# Patient Record
Sex: Female | Born: 1965 | Race: White | Hispanic: No | Marital: Married | Smoking: Never smoker
Health system: Southern US, Community
[De-identification: ages and names within clinical notes are randomized; demographics above are authoritative.]

## PROBLEM LIST (undated history)

## (undated) DIAGNOSIS — T7840XA Allergy, unspecified, initial encounter: Secondary | ICD-10-CM

## (undated) DIAGNOSIS — I10 Essential (primary) hypertension: Secondary | ICD-10-CM

## (undated) HISTORY — DX: Essential (primary) hypertension: I10

## (undated) HISTORY — DX: Allergy, unspecified, initial encounter: T78.40XA

---

## 2005-01-30 ENCOUNTER — Encounter: Admission: RE | Admit: 2005-01-30 | Discharge: 2005-01-30 | Payer: Self-pay | Admitting: Family Medicine

## 2005-02-27 ENCOUNTER — Encounter: Admission: RE | Admit: 2005-02-27 | Discharge: 2005-02-27 | Payer: Self-pay | Admitting: Obstetrics and Gynecology

## 2005-09-05 ENCOUNTER — Encounter: Admission: RE | Admit: 2005-09-05 | Discharge: 2005-09-05 | Payer: Self-pay | Admitting: Obstetrics and Gynecology

## 2005-09-06 ENCOUNTER — Other Ambulatory Visit: Admission: RE | Admit: 2005-09-06 | Discharge: 2005-09-06 | Payer: Self-pay | Admitting: Obstetrics and Gynecology

## 2005-11-30 ENCOUNTER — Ambulatory Visit (HOSPITAL_COMMUNITY): Admission: RE | Admit: 2005-11-30 | Discharge: 2005-11-30 | Payer: Self-pay | Admitting: Obstetrics and Gynecology

## 2006-06-03 ENCOUNTER — Ambulatory Visit: Payer: Self-pay | Admitting: Family Medicine

## 2006-07-08 ENCOUNTER — Ambulatory Visit: Payer: Self-pay | Admitting: Family Medicine

## 2006-09-09 ENCOUNTER — Ambulatory Visit: Payer: Self-pay | Admitting: Family Medicine

## 2006-11-15 ENCOUNTER — Ambulatory Visit: Payer: Self-pay | Admitting: Family Medicine

## 2006-12-18 ENCOUNTER — Ambulatory Visit: Payer: Self-pay | Admitting: Family Medicine

## 2007-02-17 ENCOUNTER — Ambulatory Visit: Payer: Self-pay | Admitting: Family Medicine

## 2007-03-06 ENCOUNTER — Ambulatory Visit: Payer: Self-pay | Admitting: Family Medicine

## 2007-03-07 ENCOUNTER — Ambulatory Visit: Payer: Self-pay | Admitting: Family Medicine

## 2007-03-16 IMAGING — US US RENAL
1 series · 14 of 25 positions shown · non-contrast
Comparison: None

CLINICAL DATA: Right renal atrophy

RENAL/URINARY TRACT ULTRASOUND
TECHNIQUE: Complete ultrasound examination of the urinary tract was performed
including evaluation of the kidneys, renal collecting systems, and urinary
bladder.

[Series 1: unknown · 0.23mm/px · 14 of 30 slices shown]
[im 1/30]
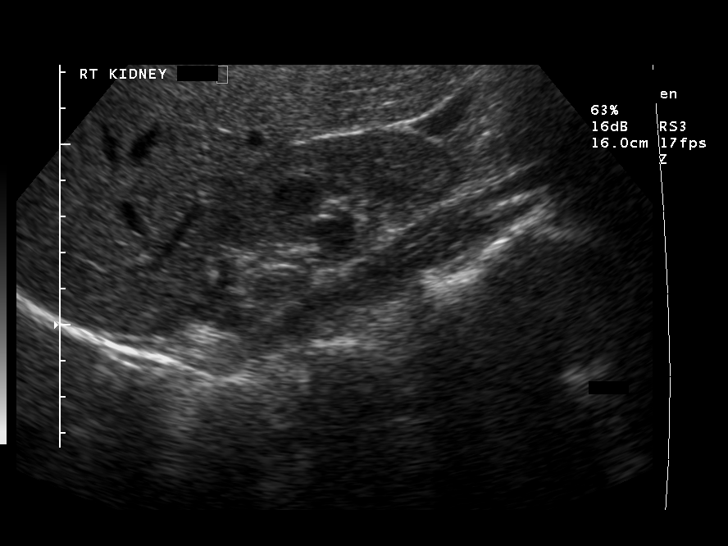
[im 3/30]
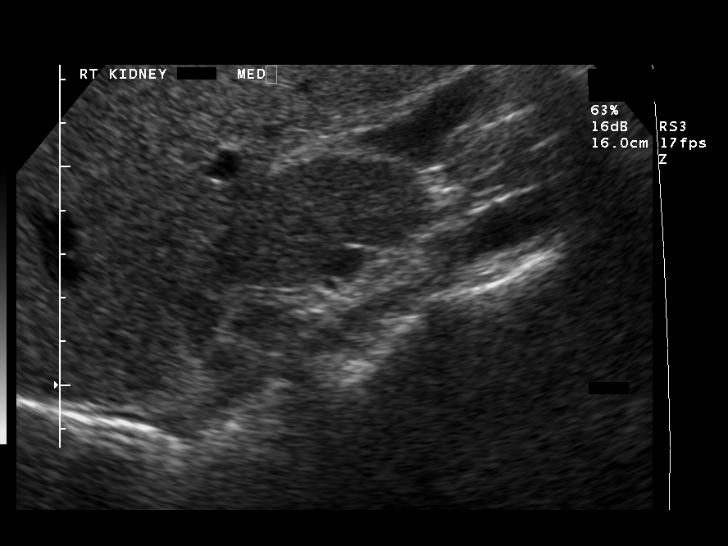
[im 5/30]
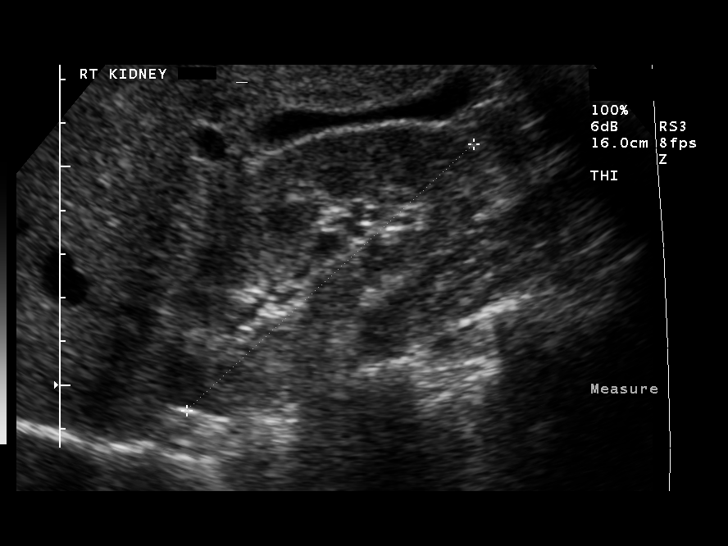
[im 8/30]
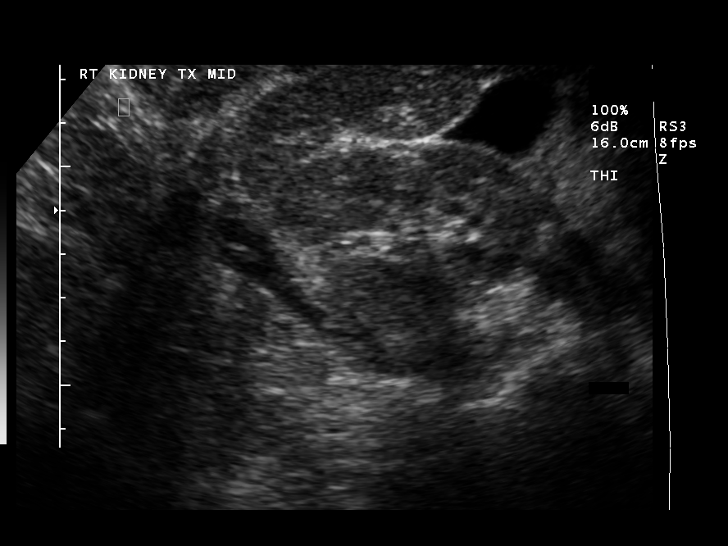
[im 10/30]
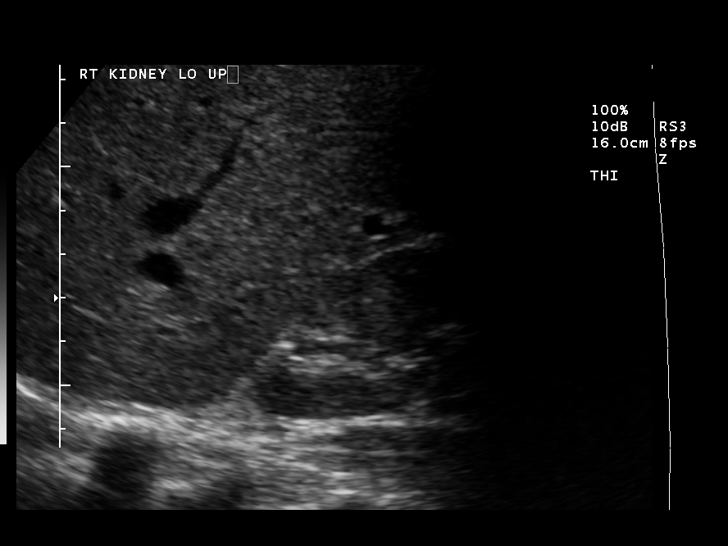
[im 11/30]
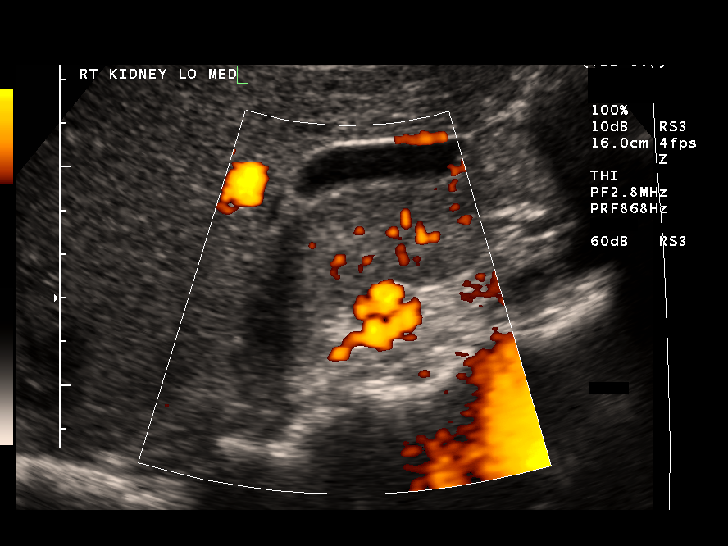
[im 14/30]
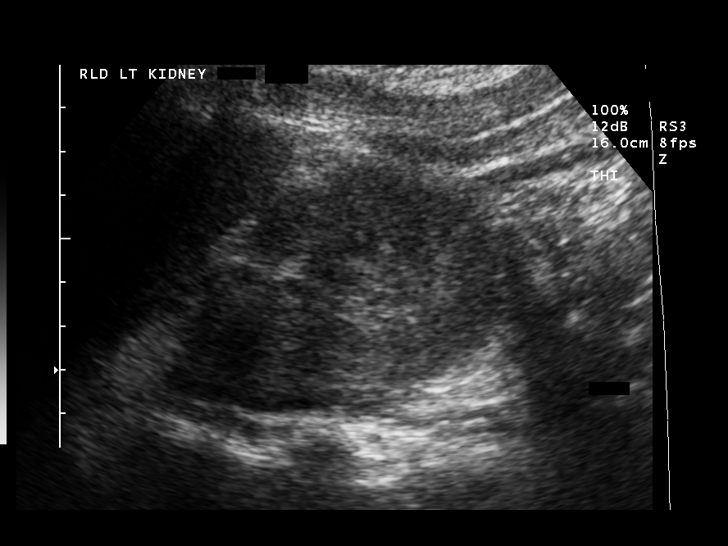
[im 16/30]
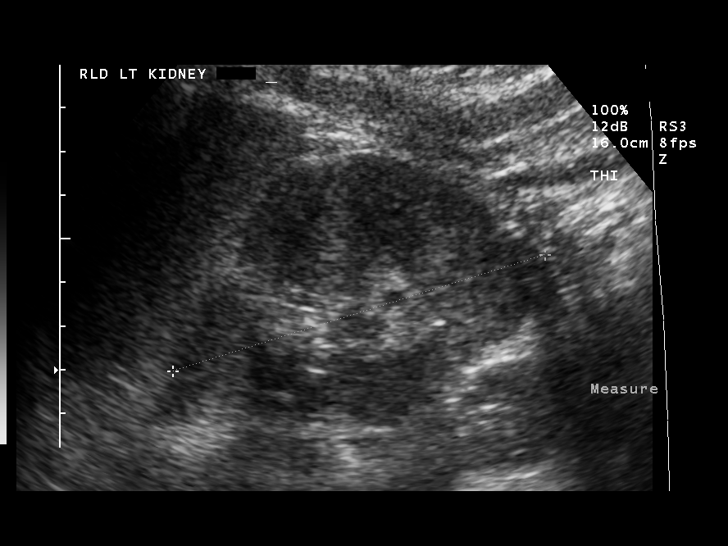
[im 19/30]
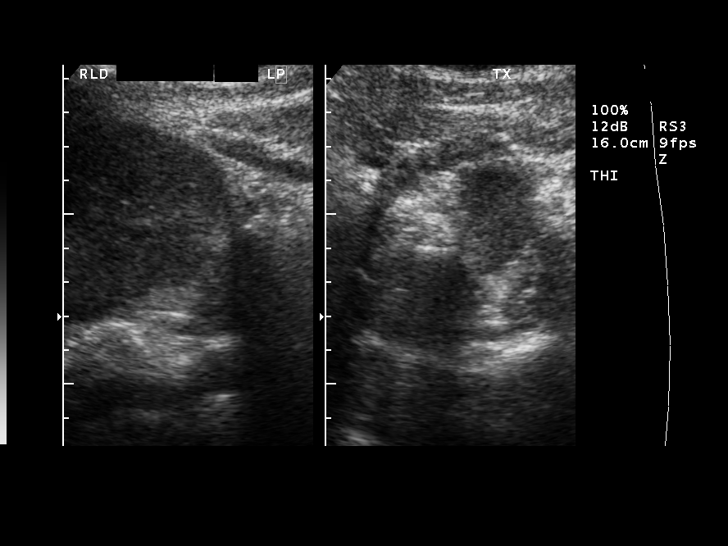
[im 20/30]
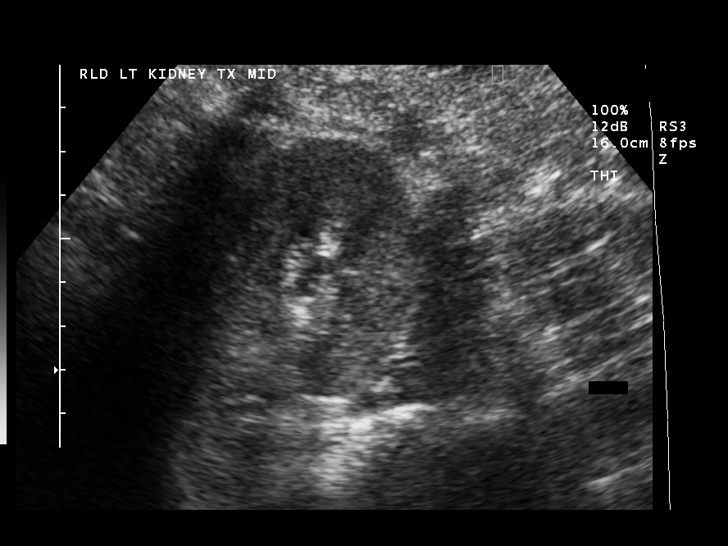
[im 22/30]
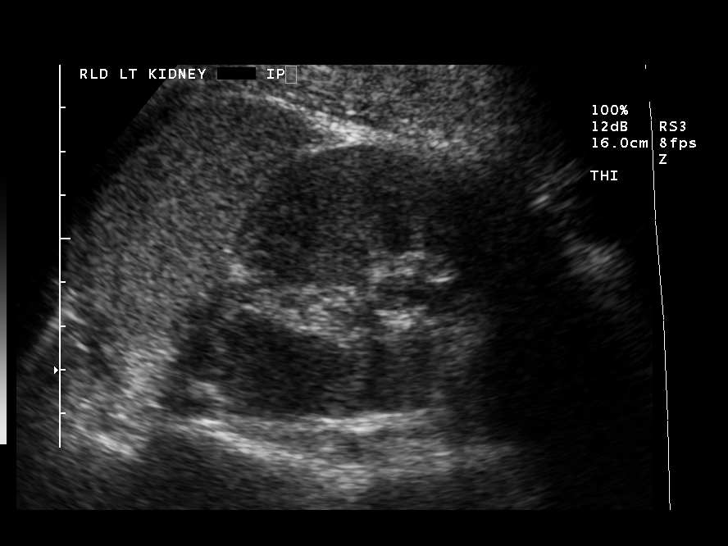
[im 25/30]
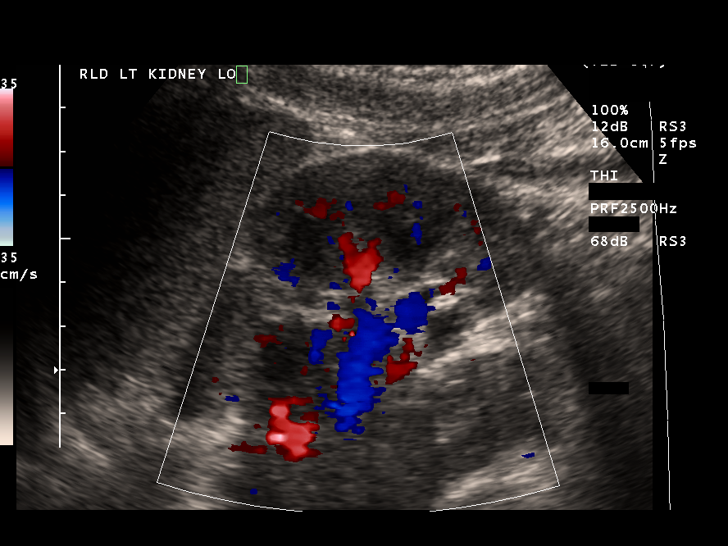
[im 27/30]
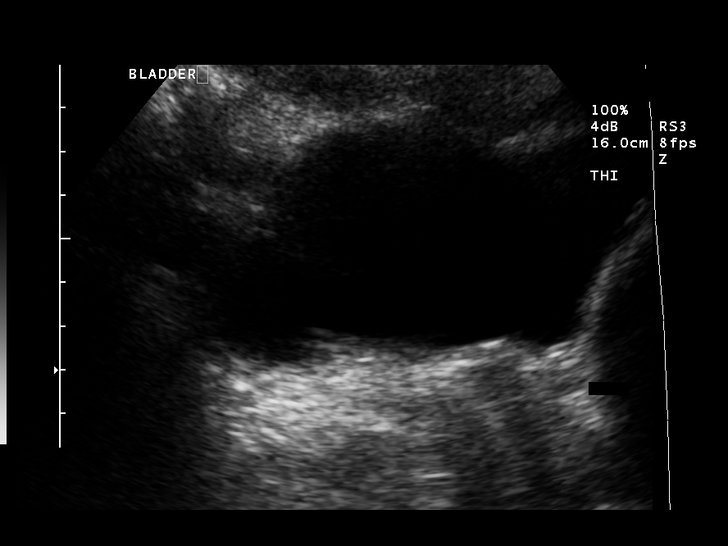
[im 30/30]
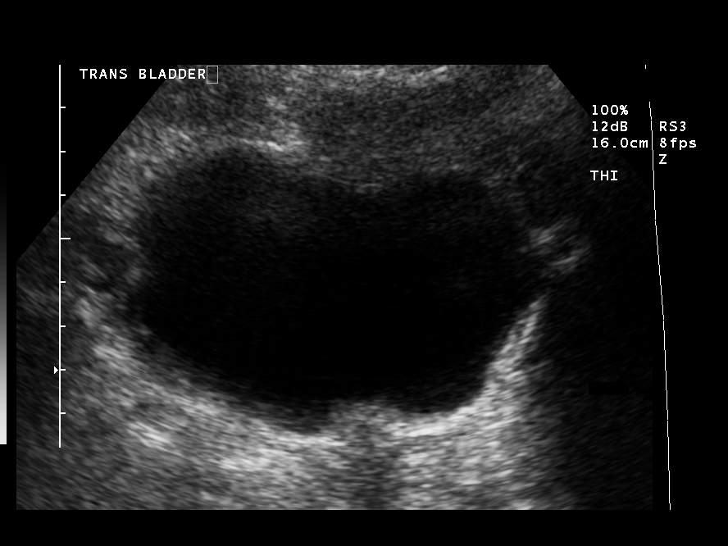

[14 of 25 positions shown; findings below may reference images not displayed]

FINDINGS: Right kidney measures 9.0 cm. Left kidney measures 9.1 cm. There is
cortical thinning noted in the upper poles bilaterally, which can be seen with
reflux nephropathy. Bladder unremarkable. No hydronephrosis.

IMPRESSION

Kidneys are symmetric in size. Cortical thinning in the upper poles bilaterally,
question chronic reflux nephropathy.

## 2007-07-03 ENCOUNTER — Ambulatory Visit: Payer: Self-pay | Admitting: Family Medicine

## 2007-07-24 ENCOUNTER — Ambulatory Visit: Payer: Self-pay | Admitting: Family Medicine

## 2007-12-16 ENCOUNTER — Ambulatory Visit: Payer: Self-pay | Admitting: Family Medicine

## 2008-01-01 ENCOUNTER — Encounter (INDEPENDENT_AMBULATORY_CARE_PROVIDER_SITE_OTHER): Payer: Self-pay | Admitting: Obstetrics and Gynecology

## 2008-01-01 ENCOUNTER — Ambulatory Visit (HOSPITAL_COMMUNITY): Admission: RE | Admit: 2008-01-01 | Discharge: 2008-01-01 | Payer: Self-pay | Admitting: Obstetrics and Gynecology

## 2008-04-26 ENCOUNTER — Ambulatory Visit: Payer: Self-pay | Admitting: Family Medicine

## 2008-08-24 ENCOUNTER — Ambulatory Visit: Payer: Self-pay | Admitting: Family Medicine

## 2009-08-31 ENCOUNTER — Ambulatory Visit: Payer: Self-pay | Admitting: Family Medicine

## 2010-02-17 ENCOUNTER — Ambulatory Visit: Payer: Self-pay | Admitting: Family Medicine

## 2010-06-27 ENCOUNTER — Ambulatory Visit: Payer: Self-pay | Admitting: Family Medicine

## 2010-12-26 NOTE — Op Note (Signed)
NAME:  Marilyn Salazar, ENNEKING                ACCOUNT NO.:  [id number]   MEDICAL RECORD NO.:  [id number]          PATIENT TYPE:  AMB   LOCATION:  [facility]                           FACILITY:  [facility]   PHYSICIAN:  Guy Sandifer. Henderson Cloud, M.D. DATE OF BIRTH:  [birth year]   DATE OF PROCEDURE:  DATE OF DISCHARGE:                               OPERATIVE REPORT   PREOPERATIVE DIAGNOSES:  Menorrhagia and pelvic pain.   POSTOPERATIVE DIAGNOSES:  Menorrhagia and pelvic pain.   PROCEDURE:  Laparoscopy with ablation of endometriosis and hysteroscopy  with dilatation and curettage.   SURGEON:  Guy Sandifer. Henderson Cloud, MD.   ANESTHESIA:  General with endotracheal intubation, Cristela Blue, MD.   SPECIMENS:  Endometrial curettings to pathology.   BLOOD LOSS:  Minimal.   DISTENDING MEDIA:  50 mL deficit.   INDICATIONS AND CONSENT:  The patient is a 45 year old married white  female G2, P2, with known endometriosis.  She has increasing  premenstrual pain and heavy menses.  Details are dictated in the history  and physical.  Laparoscopy, hysteroscopy, and D&C is discussed  preoperatively.  Potential risks and complications were reviewed  preoperatively including but limited to infection, organ damage, uterine  perforation, bleeding requiring transfusion of the blood products with  possible HIV and hepatitis acquisition, DVT, PE, pneumonia, recurrent or  heavy bleeding, or pelvic pain.  All questions were answered and consent  is signed on the chart.   FINDINGS:  Endometrial cavity is without abnormal structure.  Abdominally, upper abdomen is grossly normal.  Uterus is about 6 weeks  in size.  Anterior cul-de-sac contains a single dark red implant about 5  mm in width of endometriosis in the center of the vesicouterine fold.  Posterior cul-de-sac is normal.  There are white puckered areas  consistent with endometriosis on the bilateral pelvic sidewalls above  the course of the ureters.  Tubes and ovaries were normal.   PROCEDURE:  The patient was taken to operating room where she was  identified, placed in the dorsal supine position, and general anesthesia  was induced via endotracheal intubation.  She was then placed in dorsal  lithotomy position where she was prepped abdominally and vaginally,  bladder straight catheterized, and she was draped in a sterile fashion.  Bivalve speculum was placed in the vagina.  Anterior cervical lip was  injected with 0.5% plain Marcaine and grasped with single-tooth  tenaculum.  Paracervical block was placed at 2, 4, 5, 7, 8, and 10  o'clock positions with approximately 20 mL total of the same solution.  Cervix was gently progressively dilated, and the diagnostic hysteroscope  was placed and advanced under direct visualization.  The scope was  withdrawn and sharp curettage was carried out.  Hysteroscope was again  replaced and advanced under direct visualization and no abnormal  structures were noted.  Single-tooth tenaculum was replaced with the  Hulka tenaculum and attention was turned to the abdomen.  The  infraumbilical and suprapubic areas were injected with 0.5% plain  Marcaine and a small infraumbilical incision was made.  A disposable  Veress needle was placed on  the first attempt with a normal syringe and  drop test.  A 2 L of gas were insufflated under low pressure with good  tympany in the right upper quadrant.  Veress needle was removed and a 10-  11 XCEL bladeless disposable trocar sleeve was placed using direct  visualization with the diagnostic laparoscopic.  This was replaced with  the operative laparoscope.  A small suprapubic incision was made to the  left of midline.  The right rectus muscle body appears to not have  reperitonealized as well following her cesarean section.  An incision  was made left to the midline suprapubically to avoid this.  Transillumination was also done to avoid the inferior epigastric vessels  as well.  A 5-mm XCEL bladeless  disposable trocar sleeve was placed  under direct visualization without difficulty.  The above findings were  noted.  The above described implants were ablated with bipolar cautery.  Again, this was above the course of the ureters bilaterally.  Irrigation  was used and excess fluid was removed.  Suprapubic trocar sleeve was  removed.  Good hemostasis was noted all around.  The umbilical trocar  sleeve was removed after reducing the pneumoperitoneum.  Skin was closed  with Dermabond on both incisions.  Hulka tenaculum was removed and no  bleeding was noted.  All counts were correct.  The patient was awakened  and taken to recovery room in stable condition.      Guy Sandifer Henderson Cloud, M.D.  Electronically Signed     JET/MEDQ  D:  01/01/2008  T:  01/02/2008  Job:  [id number]

## 2010-12-26 NOTE — H&P (Signed)
NAME:  Marilyn Salazar, Marilyn Salazar                ACCOUNT NO.:  [id number]   MEDICAL RECORD NO.:  [id number]          PATIENT TYPE:  AMB   LOCATION:                                FACILITY:  [facility]   PHYSICIAN:  Guy Sandifer. Henderson Cloud, M.D. DATE OF BIRTH:  08-15-1965   DATE OF ADMISSION:  01/01/2008  DATE OF DISCHARGE:                              HISTORY & PHYSICAL   CHIEF COMPLAINT:  Heavy bleeding and pelvic pain.   HISTORY OF PRESENT ILLNESS:  This patient is a 45 year old married white  female G2, P2, with endometriosis, diagnosed on laparoscopy in 2007, has  a return of premenstrual pelvic pain as well as painful menses.  Her  menstrual flows are also getting heavier.  She will have heavy bleeding  with clots, lasting for at least 1 day a month that essentially traps  her in the house.  Ultrasound in my office on November 17, 2007, reveals a  uterus measuring 9.3 x 4.1 x 5.3 cm.  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  After discussion of options, she is being  admitted for hysteroscopy, resectoscope, dilatation and curettage, and  laparoscopy.  Potential risks and complications have been discussed  preoperatively.   PAST MEDICAL HISTORY:  1. Chronic hypertension.  2. Endometriosis.   PAST SURGICAL HISTORY:  Laparoscopy as above.   OBSTETRICAL HISTORY:  Vaginal delivery x1 and cesarean section x1.   MEDICATIONS:  1. Hydrochlorothiazide 25 mg daily.  2. Prilosec daily.   ALLERGIES:  1. PENICILLIN LEADING to HIVES (the patient has taken AMOXICILLIN with      no troubles).  2. BACTRIM, HIVES.  3. HYDROCODONE, HIVES.  4. LATEX, HIVES.   SOCIAL HISTORY:  She denies tobacco, alcohol, or drug abuse.   FAMILY HISTORY:  Positive for anemia, gallbladder disease, UTIs,  osteoporosis, hypertension, and cancer.   REVIEW OF SYSTEMS:  NEURO:  Denies headache.  CARDIAC:  Denies chest  pain.  PULMONARY:  Denies shortness of breath.   PHYSICAL EXAMINATION:  VITAL SIGNS:  Height 5 feet  5 inches, blood  pressure 118/82, and weight 137 pounds.  HEENT:  Without thyromegaly.  LUNGS:  Clear to auscultation.  HEART:  Regular rate and rhythm.  BACK:  Without CVA tenderness.  BREASTS:  Without mass, retraction, or discharge.  ABDOMEN:  Soft and nontender without masses.  PELVIC:  Vulvovaginal and cervix without lesion.  Uterus, anteverted,  upper, normal size, mobile, and nontender.  Adnexa nontender without  masses.  EXTREMITIES:  Grossly within normal limits.  NEUROLOGICAL:  Grossly within normal limits.   ASSESSMENT:  Heavy menses and pelvic pain.   PLAN:  Laparoscopy, hysteroscopy, resectoscope, and dilatation and  curettage.      Guy Sandifer Henderson Cloud, M.D.  Electronically Signed     JET/MEDQ  D:  12/24/2007  T:  12/25/2007  Job:  [id number]

## 2010-12-29 NOTE — Op Note (Signed)
NAME:  Marilyn Salazar, Marilyn Salazar                ACCOUNT NO.:  [id number]   MEDICAL RECORD NO.:  [id number]          PATIENT TYPE:  AMB   LOCATION:  [facility]                           FACILITY:  [facility]   PHYSICIAN:  Guy Sandifer. Henderson Cloud, M.D. DATE OF BIRTH:  08/21/1965   DATE OF PROCEDURE:  11/30/2005  DATE OF DISCHARGE:                                 OPERATIVE REPORT   PREOPERATIVE DIAGNOSIS:  Dysmenorrhea.   POSTOPERATIVE DIAGNOSIS:  Endometriosis.   PROCEDURE:  Laparoscopy with ablation of endometriosis and chromopertubation  of fallopian tubes.   SURGEON:  Guy Sandifer. Henderson Cloud, M.D.   ANESTHESIA:  General endotracheal intubation.   ESTIMATED BLOOD LOSS:  Drops.   INDICATIONS AND CONSENT:  The patient is a 45 year old married white female,  G2, P2 with increasingly severe pain with menses. Details are dictated in  the history and physical. Laparoscopy with chromopertubation of the  fallopian tubes was discussed preoperatively. The potential risks and  complications have been reviewed including but limited to infection, bowel,  bladder, ureteral damage, bleeding requiring transfusion of blood products  with possible transfusion reaction, HIV and hepatitis acquisition, DVT, PE,  pneumonia, recurrent pain. All questions were answered and consent signed on  the chart.   FINDINGS:  The upper abdomen is grossly normal. The appendix was normal. The  uterus was upper normal in size, smooth in contour. Anterior cul-de-sac  contained scarring secondary to cesarean section. There are approximately  three dark, black implants of endometriosis measuring 5-6 mm a piece in the  anterior cul-de-sac. Posteriorly there are two white puckered implants of  endometriosis on the left pelvic sidewall. Posterior cul-de-sac is normal.  Right pelvic sidewall is normal. Tubes and ovaries normal bilaterally.  Indigo carmine was seen to spill from both fallopian tubes. Fimbria were  normal bilaterally.   PROCEDURE:  The  patient is taken to the operating room where she is  identified, placed in dorsal lithotomy position, and general anesthesia is  induced via endotracheal intubation. She is then placed in the dorsal  lithotomy position, prepped abdominally and vaginally, and straight  catheterized. A single-tooth tenaculum was placed on the anterior cervix and  an Acorn tenaculum was placed in the endocervical canal. She is then draped  in sterile fashion. The infraumbilical and suprapubic areas were infiltrated  0.5% plain Marcaine. An infraumbilical incision was then made. A disposable  Veress needle was placed with a normal syringe and drop test. 2 liters of  gas were insufflated under low pressure with good tympany in the right upper  quadrant. The Veress needle was removed and a 10/11 Excel bladeless  disposable trocar sleeve was placed using direct visualization with the  diagnostic laparoscopic. This was then replaced with the operative  laparoscopic. A small suprapubic incision was made and a 5-mm Excel  bladeless disposable trocar sleeve was then placed under direct  visualization without difficulty. The above findings were noted. Bipolar  cautery was used to ablate the areas of endometriosis. After instilling the  indigo carmine and seeing spill from the tubes bilaterally, the procedure  was terminated. The suprapubic trocar  sleeve was removed and  good hemostasis was noted all around. Pneumoperitoneum was completely  reduced the umbilical trocar sleeve was removed. Both skin incisions were  closed with Dermabond. The instruments were removed from the vagina and good  hemostasis is noted. All counts are correct. The patient is taken to the  recovery room in stable condition.      Guy Sandifer Henderson Cloud, M.D.  Electronically Signed     JET/MEDQ  D:  11/30/2005  T:  11/30/2005  Job:  [id number]

## 2010-12-29 NOTE — H&P (Signed)
NAME:  Marilyn Salazar, LAPINE                ACCOUNT NO.:  [id number]   MEDICAL RECORD NO.:  [id number]          PATIENT TYPE:  AMB   LOCATION:  [facility]                           [facility]ACILITY:  [facility]   PHYSICIAN:  Guy Sandifer. Henderson Cloud, M.D. DATE OF BIRTH:  1966-08-05   DATE OF ADMISSION:  11/30/2005  DATE OF DISCHARGE:                                HISTORY & PHYSICAL   CHIEF COMPLAINT:  Painful menses.   HISTORY OF PRESENT ILLNESS:  This patient is a 45 year old married white  female, G2, P2, who is having increasingly severe pain on the first two days  of her menses.  Medications only partially relieved this.  She has a history  of severe endometriosis in her mother and her sister.  After discussion of  options, she is being admitted for laparoscopy.  Potential risks and  complications have been reviewed preoperatively.   PAST MEDICAL HISTORY:  Chronic hypertension.   PAST SURGICAL HISTORY:  Negative.   OBSTETRICAL HISTORY:  Vaginal delivery x1.  Cesarean section x1.   MEDICATIONS:  Toprol XL 25 mg 1/2 pill daily.   ALLERGIES:  1.  PENICILLIN, leading to hives.  2.  BACTRIM, leading to hives.   SOCIAL HISTORY:  Denies tobacco, alcohol, or drug abuse.   REVIEW OF SYSTEMS:  NEURO:  Denies headache.  CARDIO:  Denies chest pain.  PULMONARY:  Denies shortness of breath.  GI:  Denies recent changes in bowel  habits.   PHYSICAL EXAMINATION:  VITAL SIGNS:  Height 5 feet 5 inches.  Weight 131  pounds.  Blood pressure 130/88.  HEENT:  Without thyromegaly.  LUNGS:  Clear to auscultation.  HEART:  Regular rate and rhythm.  BACK:  Without CVA tenderness.  BREASTS:  Without mass, rash, or discharge.  ABDOMEN:  Soft and nontender without masses.  PELVIC:  The vagina and cervix without lesion.  Uterus is normal size,  mobile, nontender.  Adnexa nontender without masses.  EXTREMITIES:  Grossly within normal limits.  NEUROLOGIC:  Grossly within normal limits.   ASSESSMENT:  Dysmenorrhea.   PLAN:   Laparoscopy with chromopertrubation of fallopian tubes.      Guy Sandifer Henderson Cloud, M.D.  Electronically Signed     JET/MEDQ  D:  11/28/2005  T:  11/28/2005  Job:  [id number]

## 2011-02-22 ENCOUNTER — Other Ambulatory Visit: Payer: Self-pay | Admitting: Family Medicine

## 2011-04-04 ENCOUNTER — Encounter: Payer: Self-pay | Admitting: Family Medicine

## 2011-05-09 LAB — CBC
Hemoglobin: 14.3
RBC: 4.8

## 2011-05-09 LAB — BASIC METABOLIC PANEL
CO2: 29
Calcium: 9.2
Creatinine, Ser: 0.98
GFR calc Af Amer: >60
GFR calc non Af Amer: >60
Glucose, Bld: 98

## 2011-05-09 LAB — DIFFERENTIAL
Basophils Absolute: 0
Basophils Relative: 1
Eosinophils Absolute: 0
Eosinophils Relative: 1
Monocytes Absolute: 0.3

## 2011-05-15 ENCOUNTER — Ambulatory Visit (INDEPENDENT_AMBULATORY_CARE_PROVIDER_SITE_OTHER): Payer: PRIVATE HEALTH INSURANCE | Admitting: Family Medicine

## 2011-05-15 ENCOUNTER — Encounter: Payer: Self-pay | Admitting: Family Medicine

## 2011-05-15 VITALS — BP 120/70 | HR 73 | Temp 98.0°F | Wt 135.0 lb

## 2011-05-15 DIAGNOSIS — J329 Chronic sinusitis, unspecified: Secondary | ICD-10-CM

## 2011-05-15 MED ORDER — CLARITHROMYCIN 500 MG PO TABS: 500.0000 mg | ORAL_TABLET | Freq: Two times a day (BID) | ORAL | Status: AC

## 2011-05-15 NOTE — Progress Notes (Signed)
  Subjective:    Patient ID: Marilyn Salazar, female    DOB: 28-Aug-1965, 45 y.o.   MRN: [id number]  HPI She complains of a two-month history of intermittent greenish drainage mainly from the left nostril. No headache, sore throat, cough or congestion. She does not smoke. She does have underlying allergies.   Review of Systems     Objective:   Physical Exam alert and in no distress. Tympanic membranes and canals are normal. Throat is clear. Tonsils are normal. Neck is supple without adenopathy or thyromegaly. Cardiac exam shows a regular sinus rhythm without murmurs or gallops. Lungs are clear to auscultation. Nasal mucosa is slightly red with tenderness especially over left maxillary sinus       Assessment & Plan:  Sinusitis We'll treat with Biaxin. She is to call if no improvement.

## 2011-05-15 NOTE — Patient Instructions (Signed)
Take all the antibiotic and if not entirely better, give me a call

## 2011-06-04 ENCOUNTER — Other Ambulatory Visit: Payer: Self-pay | Admitting: Obstetrics and Gynecology

## 2011-06-04 DIAGNOSIS — R928 Other abnormal and inconclusive findings on diagnostic imaging of breast: Secondary | ICD-10-CM

## 2011-06-14 ENCOUNTER — Ambulatory Visit
Admission: RE | Admit: 2011-06-14 | Discharge: 2011-06-14 | Disposition: A | Discharge status: Home / Self Care | Payer: PRIVATE HEALTH INSURANCE | Source: Ambulatory Visit | Attending: Obstetrics and Gynecology | Admitting: Obstetrics and Gynecology

## 2011-06-14 DIAGNOSIS — R928 Other abnormal and inconclusive findings on diagnostic imaging of breast: Secondary | ICD-10-CM

## 2011-10-31 ENCOUNTER — Other Ambulatory Visit: Payer: Self-pay | Admitting: Family Medicine

## 2011-12-31 ENCOUNTER — Other Ambulatory Visit: Payer: Self-pay | Admitting: Obstetrics and Gynecology

## 2011-12-31 DIAGNOSIS — R921 Mammographic calcification found on diagnostic imaging of breast: Secondary | ICD-10-CM

## 2012-01-15 ENCOUNTER — Ambulatory Visit
Admission: RE | Admit: 2012-01-15 | Discharge: 2012-01-15 | Disposition: A | Discharge status: Home / Self Care | Payer: PRIVATE HEALTH INSURANCE | Source: Ambulatory Visit | Attending: Obstetrics and Gynecology | Admitting: Obstetrics and Gynecology

## 2012-01-15 ENCOUNTER — Other Ambulatory Visit: Payer: Self-pay | Admitting: Obstetrics and Gynecology

## 2012-01-15 DIAGNOSIS — R921 Mammographic calcification found on diagnostic imaging of breast: Secondary | ICD-10-CM

## 2016-11-07 DIAGNOSIS — J0100 Acute maxillary sinusitis, unspecified: Secondary | ICD-10-CM | POA: Diagnosis not present

## 2016-11-13 ENCOUNTER — Encounter: Payer: Self-pay | Admitting: Family Medicine

## 2016-11-13 ENCOUNTER — Ambulatory Visit (INDEPENDENT_AMBULATORY_CARE_PROVIDER_SITE_OTHER): Payer: BLUE CROSS/BLUE SHIELD | Admitting: Family Medicine

## 2016-11-13 VITALS — BP 120/70 | HR 71 | Ht 65.5 in | Wt 143.0 lb

## 2016-11-13 DIAGNOSIS — M79641 Pain in right hand: Secondary | ICD-10-CM

## 2016-11-13 DIAGNOSIS — M79642 Pain in left hand: Secondary | ICD-10-CM | POA: Diagnosis not present

## 2016-11-13 NOTE — Progress Notes (Signed)
   Subjective:    Patient ID: Marilyn Salazar, female    DOB: Jun 11, 1966, 51 y.o.   MRN: [id number]  HPI She is here for evaluation of a one month history of bilateral fourth and fifth finger swelling and erythema and in the last 2 weeks she is noted pain. No other joints are involved. No other rash, fever, chills, recent exposures or trauma. She says that sometimes the fourth and fifth fingers do feel warm.   Review of Systems     Objective:   Physical Exam Alert and in no distress. Exam of the fourth and fifth fingers bilaterally does show swelling and tenderness to palpation in the PIP joint with an erythematous nodular lesions noted medially over the PIP joint       Assessment & Plan:  Bilateral hand pain - Plan: Ambulatory referral to Rheumatology Case was discussed with Dr. Estanislado Pandy. Patient has left and with her next visit here, we will do CBC, cmet, sedimentation rate, rheumatoid factor, CCP, uric acid, HLA-B27, ANA. Information is also to take a picture of her hands.

## 2016-11-14 ENCOUNTER — Telehealth: Payer: Self-pay | Admitting: Family Medicine

## 2016-11-14 NOTE — Telephone Encounter (Signed)
Called pt she will come in 2 weeks to have her labs drawn and photo of hands taken. Referral is in system awaiting Dr Titus Dubin approval.

## 2016-11-14 NOTE — Telephone Encounter (Signed)
Called pt back, she took pictures yesterday.  Made her aware that her blood needs to be drawn and resulted before Dr.Deveshwars visit.  So she will make sure that happens.

## 2016-11-14 NOTE — Telephone Encounter (Signed)
She needs to take a picture of her hands now. I want to draw the blood prior to her being seen by Dr. Corliss Skains so we need to know when the appointment is before we can schedule a blood work

## 2016-11-15 ENCOUNTER — Other Ambulatory Visit: Payer: Self-pay

## 2016-11-15 DIAGNOSIS — M79641 Pain in right hand: Secondary | ICD-10-CM

## 2016-11-15 DIAGNOSIS — M79642 Pain in left hand: Principal | ICD-10-CM

## 2016-11-16 ENCOUNTER — Telehealth: Payer: Self-pay

## 2016-11-16 NOTE — Telephone Encounter (Signed)
She will call when she is in town for [employer]

## 2016-11-16 NOTE — Telephone Encounter (Signed)
Dr.Lalonde she has appointment  11/29/16 when do we need to get her in for labs please advise

## 2016-11-26 ENCOUNTER — Other Ambulatory Visit: Payer: BLUE CROSS/BLUE SHIELD

## 2016-12-19 ENCOUNTER — Telehealth: Payer: Self-pay | Admitting: Family Medicine

## 2016-12-19 DIAGNOSIS — Z0000 Encounter for general adult medical examination without abnormal findings: Secondary | ICD-10-CM

## 2016-12-19 NOTE — Telephone Encounter (Signed)
Pt coming in next Tues for labs, please place lab orders.

## 2016-12-21 NOTE — Progress Notes (Signed)
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 female seen in consultation per request of her PCP. According to patient she started having some raised red spots over bilateral fourth and fifth PIP joints. She states started after she was working in an attic for a while. She's been having increased pain in her hands since then she also describes nocturnal pain. She has intermittent swelling and has difficulty wearing her rings. None of the other joints are painful or swollen. There is no family history of autoimmune disease she denies any history of psoriasis, diarrhea, and Raynaud's phenomenon.   Activities of Daily Living:  Patient reports morning stiffness for 1 hour.   Patient Reports nocturnal pain.  Difficulty dressing/grooming: Denies Difficulty climbing stairs: Denies Difficulty getting out of chair: Denies Difficulty using hands for taps, buttons, cutlery, and/or writing: Denies   Review of Systems  Constitutional: Negative for fatigue, night sweats, weight gain, weight loss and weakness.  HENT: Negative for mouth sores, trouble swallowing, trouble swallowing, mouth dryness and nose dryness.        History of nasal ulcersx2 mths.  Eyes: Negative for pain, redness, visual disturbance and dryness.  Respiratory: Negative for cough, shortness of breath and difficulty breathing.   Cardiovascular: Positive for hypertension. Negative for chest pain, palpitations, irregular heartbeat and swelling in legs/feet.  Gastrointestinal: Negative for blood in stool, constipation and diarrhea.  Endocrine: Negative for increased urination.  Genitourinary: Negative for vaginal  dryness.  Musculoskeletal: Positive for arthralgias, joint pain, joint swelling and morning stiffness. Negative for myalgias, muscle weakness, muscle tenderness and myalgias.  Skin: Negative for color change, rash, hair loss, skin tightness, ulcers and sensitivity to sunlight.  Allergic/Immunologic: Negative for susceptible to infections.  Neurological: Negative for dizziness, memory loss and night sweats.  Hematological: Negative for swollen glands.  Psychiatric/Behavioral: Negative for depressed mood and sleep disturbance. The patient is nervous/anxious.     PMFS History:  Patient Active Problem List   Diagnosis Date Noted  . History of hypertension 12/31/2016  . History of anxiety 12/31/2016  . History of gastroesophageal reflux (GERD) 12/31/2016  . History of seasonal allergies 12/31/2016    Past Medical History:  Diagnosis Date  . Allergy    RHINITIS  . Hypertension     History reviewed. No pertinent family history. History reviewed. No pertinent surgical history. Social History   Social History Narrative  . No narrative on file     Objective: Vital Signs: BP 112/64   Pulse 74   Resp 14   Ht 5\' 6"  (1.676 m)   Wt 140 lb (63.5 kg)   LMP 11/09/2016   BMI 22.60 kg/m    Physical Exam  Constitutional: She is oriented to person, place, and time. She appears well-developed and well-nourished.  HENT:  Head: Normocephalic and atraumatic.  Eyes: Conjunctivae and EOM are normal.  Neck: Normal range of motion.  Cardiovascular: Normal rate, regular rhythm, normal heart sounds and intact distal pulses.   Pulmonary/Chest: Effort normal  and breath sounds normal.  Abdominal: Soft. Bowel sounds are normal.  Lymphadenopathy:    She has no cervical adenopathy.  Neurological: She is alert and oriented to person, place, and time.  Skin: Skin is warm and dry. Capillary refill takes less than 2 seconds.  Psychiatric: She has a normal mood and affect. Her behavior is normal.    Nursing note and vitals reviewed.    Musculoskeletal Exam: C-spine and thoracic lumbar spine good range of motion. No SI joint tenderness. Shoulder joints elbow joints wrist joints MCPs PIPs DIPs are good range of motion. She has mild tenderness over bilateral fifth PIP and DIP joint. 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 Hip joints knee joints ankles MTPs PIPs DIPs are good range of motion with no synovitis.  CDAI Exam: No CDAI exam completed.    Investigation: Findings:  12/25/16 CBC normal, CMP elevated Creat 1.14, otherwise normal LDL 131    Imaging: No results found.  Speciality Comments: No specialty comments available.    Procedures:  No procedures performed Allergies: Bactrim; Hydrocodone; Latex; and Penicillins   Assessment / Plan:     Visit Diagnoses: Bilateral hand pain -no synovitis was noted on clinical examination. She complains of increase morning stiffness, intermittent swelling and nocturnal pain. Plan: XR Hand 2 View Right, XR Hand 2 View Left. I'll schedule ultrasound of bilateral hands to look for synovitis. And we will obtain following labs to evaluate this further.  History of hypertension: controlled on current antihypertensives.  History of anxiety: On Zoloft.  History of gastroesophageal reflux (GERD) she takes Prilosec for her symptoms which appear to be controlled.  History of seasonal allergies : Reports relief with Zyrtec.   Orders: Orders Placed This Encounter  Procedures  . XR Hand 2 View Right  . XR Hand 2 View Left  . Sedimentation rate  . CK  . Rheumatoid factor  . Cyclic citrul peptide antibody, IgG  . ANA  . Angiotensin converting enzyme   Meds ordered this encounter  Medications  . diclofenac sodium (VOLTAREN) 1 % GEL    Sig: Apply 2 g topically 4 (four) times daily.    Dispense:  3 Tube    Refill:  3    OA hands    Face-to-face time spent with patient was 45 minutes. 50% of time was spent in  counseling and coordination of care.  Follow-Up Instructions: Return for pain hands.   Pollyann SavoyShaili Alexandrina Fiorini, MD  Note - This record has been created using Animal nutritionistDragon software.  Chart creation errors have been sought, but may not always  have been located. Such creation errors do not reflect on  the standard of medical care.

## 2016-12-25 ENCOUNTER — Other Ambulatory Visit: Payer: BLUE CROSS/BLUE SHIELD

## 2016-12-25 DIAGNOSIS — Z0000 Encounter for general adult medical examination without abnormal findings: Secondary | ICD-10-CM | POA: Diagnosis not present

## 2016-12-25 LAB — COMPREHENSIVE METABOLIC PANEL
ALBUMIN: 4.7 g/dL (ref 3.6–5.1)
ALT: 11 U/L (ref 6–29)
AST: 14 U/L (ref 10–35)
Alkaline Phosphatase: 64 U/L (ref 33–115)
BUN: 16 mg/dL (ref 7–25)
CALCIUM: 9.9 mg/dL (ref 8.6–10.2)
CHLORIDE: 101 mmol/L (ref 98–110)
CO2: 30 mmol/L (ref 20–31)
Creat: 1.14 mg/dL — ABNORMAL HIGH (ref 0.50–1.10)
Glucose, Bld: 88 mg/dL (ref 65–99)
POTASSIUM: 4.2 mmol/L (ref 3.5–5.3)
Sodium: 140 mmol/L (ref 135–146)
TOTAL PROTEIN: 7.7 g/dL (ref 6.1–8.1)
Total Bilirubin: 0.5 mg/dL (ref 0.2–1.2)

## 2016-12-25 LAB — CBC WITH DIFFERENTIAL/PLATELET
BASOS ABS: 0 {cells}/uL (ref 0–200)
Basophils Relative: 0 %
EOS ABS: 85 {cells}/uL (ref 15–500)
EOS PCT: 1 %
HEMATOCRIT: 42.9 % (ref 35.0–45.0)
HEMOGLOBIN: 14.4 g/dL (ref 11.7–15.5)
LYMPHS ABS: 1870 {cells}/uL (ref 850–3900)
Lymphocytes Relative: 22 %
MCH: 28.7 pg (ref 27.0–33.0)
MCHC: 33.6 g/dL (ref 32.0–36.0)
MCV: 85.6 fL (ref 80.0–100.0)
MPV: 10.5 fL (ref 7.5–12.5)
Monocytes Absolute: 340 cells/uL (ref 200–950)
Monocytes Relative: 4 %
NEUTROS PCT: 73 %
Neutro Abs: 6205 cells/uL (ref 1500–7800)
Platelets: 270 10*3/uL (ref 140–400)
RBC: 5.01 MIL/uL (ref 3.80–5.10)
RDW: 13.7 % (ref 11.0–15.0)
WBC: 8.5 10*3/uL (ref 4.0–10.5)

## 2016-12-25 LAB — LIPID PANEL
CHOLESTEROL: 220 mg/dL — AB (ref <200)
HDL: 74 mg/dL (ref >50)
LDL CALC: 131 mg/dL — AB (ref <100)
TRIGLYCERIDES: 74 mg/dL (ref <150)
Total CHOL/HDL Ratio: 3 Ratio (ref <5.0)
VLDL: 15 mg/dL (ref <30)

## 2016-12-31 ENCOUNTER — Ambulatory Visit (INDEPENDENT_AMBULATORY_CARE_PROVIDER_SITE_OTHER): Payer: BLUE CROSS/BLUE SHIELD

## 2016-12-31 ENCOUNTER — Ambulatory Visit (INDEPENDENT_AMBULATORY_CARE_PROVIDER_SITE_OTHER): Payer: BLUE CROSS/BLUE SHIELD | Admitting: Rheumatology

## 2016-12-31 ENCOUNTER — Encounter (INDEPENDENT_AMBULATORY_CARE_PROVIDER_SITE_OTHER): Payer: Self-pay

## 2016-12-31 ENCOUNTER — Encounter: Payer: Self-pay | Admitting: Rheumatology

## 2016-12-31 VITALS — BP 112/64 | HR 74 | Resp 14 | Ht 66.0 in | Wt 140.0 lb

## 2016-12-31 DIAGNOSIS — Z8659 Personal history of other mental and behavioral disorders: Secondary | ICD-10-CM | POA: Diagnosis not present

## 2016-12-31 DIAGNOSIS — Z8679 Personal history of other diseases of the circulatory system: Secondary | ICD-10-CM | POA: Insufficient documentation

## 2016-12-31 DIAGNOSIS — M79642 Pain in left hand: Secondary | ICD-10-CM | POA: Diagnosis not present

## 2016-12-31 DIAGNOSIS — M79641 Pain in right hand: Secondary | ICD-10-CM

## 2016-12-31 DIAGNOSIS — Z889 Allergy status to unspecified drugs, medicaments and biological substances status: Secondary | ICD-10-CM | POA: Diagnosis not present

## 2016-12-31 DIAGNOSIS — Z8719 Personal history of other diseases of the digestive system: Secondary | ICD-10-CM | POA: Diagnosis not present

## 2016-12-31 MED ORDER — DICLOFENAC SODIUM 1 % TD GEL: 2.0000 g | Freq: Four times a day (QID) | TRANSDERMAL | 3 refills | Status: AC

## 2016-12-31 NOTE — Progress Notes (Signed)
Patient was prescribed Voltaren gel today.  I counseled patient on the purpose, proper use, and adverse effects of Voltaren gel.  Discussed use of GoodRx coupon card if the medication is not covered by insurance.  Patient voiced understanding.  I also counseled patient on the purpose, proper use, and adverse effects of OA supplement list.     Lilla Shookachel Henderson, Pharm.D., BCPS, CPP Clinical Pharmacist Pager: [phone] Phone: [phone] 12/31/2016 9:37 AM

## 2016-12-31 NOTE — Patient Instructions (Signed)
   Supplements for OA Natural anti-inflammatories  You can purchase these at [employer], [employer] or online.  . Turmeric (capsules)  . Ginger (ginger root or capsules)  . Omega 3 (Fish, flax seeds, chia seeds, walnuts, almonds)  . Tart cherry (dried or extract)   Patient should be under the care of a physician while taking these supplements. This may not be reproduced without the permission of Dr. Shaili Deveshwar.  

## 2017-01-01 ENCOUNTER — Telehealth: Payer: Self-pay

## 2017-01-01 LAB — RHEUMATOID FACTOR

## 2017-01-01 LAB — ANA: Anti Nuclear Antibody(ANA): POSITIVE — AB

## 2017-01-01 LAB — CK: Total CK: 75 U/L (ref 29–143)

## 2017-01-01 LAB — ANTI-NUCLEAR AB-TITER (ANA TITER): ANA Titer 1: 1:160 {titer} — ABNORMAL HIGH

## 2017-01-01 LAB — SEDIMENTATION RATE: SED RATE: 4 mm/h (ref 0–20)

## 2017-01-01 LAB — CYCLIC CITRUL PEPTIDE ANTIBODY, IGG

## 2017-01-01 LAB — ANGIOTENSIN CONVERTING ENZYME: Angiotensin-Converting Enzyme: 40 U/L (ref 9–67)

## 2017-01-01 NOTE — Telephone Encounter (Signed)
A prior authorization request was submitted through cover my meds. Will update once we receive a response.   Marilyn Salazar, [place], CPhT 8:39 AM

## 2017-01-01 NOTE — Progress Notes (Signed)
Add ENA, C3-C4, anti-cardiolipin, lupus anticoagulant, beta-2. Please check if ultrasound of bilateral hands as a scheduled

## 2017-01-03 ENCOUNTER — Other Ambulatory Visit: Payer: Self-pay | Admitting: *Deleted

## 2017-01-03 DIAGNOSIS — M79642 Pain in left hand: Principal | ICD-10-CM

## 2017-01-03 DIAGNOSIS — M79641 Pain in right hand: Secondary | ICD-10-CM

## 2017-01-03 NOTE — Telephone Encounter (Signed)
[facility] DEN[facility] for Voltaren Gel.  Reference number:[id number] Phone number:[phone]  Will send document to scan center.  Spoke with patient to update her. Told her about the goodrx coupon to help with th cost. Patient voiced understanding and denies any questions at this time.  Joon Pohle, [place], CPhT  10:09 AM

## 2017-01-08 ENCOUNTER — Other Ambulatory Visit: Payer: Self-pay

## 2017-01-08 DIAGNOSIS — M79642 Pain in left hand: Secondary | ICD-10-CM | POA: Diagnosis not present

## 2017-01-08 DIAGNOSIS — M79641 Pain in right hand: Secondary | ICD-10-CM | POA: Diagnosis not present

## 2017-01-09 LAB — CARDIOLIPIN ANTIBODIES, IGG, IGM, IGA: Anticardiolipin IgG: <14 [GPL'U]

## 2017-01-09 LAB — CP5000020 ENA PANEL
ENA SM Ab Ser-aCnc: <1
RIBONUCLEIC PROTEIN(ENA) ANTIBODY, IGG: NEGATIVE
SCLERODERMA (SCL-70) (ENA) ANTIBODY, IGG: NEGATIVE
SSA (Ro) (ENA) Antibody, IgG: <1
SSB (La) (ENA) Antibody, IgG: <1
ds DNA Ab: 3 IU/mL

## 2017-01-09 LAB — C3 AND C4
C3 Complement: 95 mg/dL (ref 83–193)
C4 COMPLEMENT: 27 mg/dL (ref 15–57)

## 2017-01-10 LAB — BETA-2 GLYCOPROTEIN ANTIBODIES
Beta-2 Glyco I IgG: <9 SGU (ref <=20)
Beta-2-Glycoprotein I IgA: <9 SAU (ref <=20)
Beta-2-Glycoprotein I IgM: <9 SMU (ref <=20)

## 2017-01-10 LAB — RFX DRVVT SCR W/RFLX CONF 1:1 MIX: DRVVT SCREEN: 34 s (ref <=45)

## 2017-01-10 LAB — RFX PTT-LA W/RFX TO HEX PHASE CONF: PTT-LA Screen: 36 s (ref <=40)

## 2017-01-10 LAB — LUPUS ANTICOAGULANT PANEL

## 2017-01-11 NOTE — Progress Notes (Signed)
wnl

## 2017-02-04 ENCOUNTER — Ambulatory Visit: Payer: BLUE CROSS/BLUE SHIELD | Admitting: Rheumatology

## 2017-02-22 DIAGNOSIS — R768 Other specified abnormal immunological findings in serum: Secondary | ICD-10-CM | POA: Insufficient documentation

## 2017-02-22 NOTE — Progress Notes (Signed)
Office Visit Note  Patient: Marilyn Salazar             Date of Birth: Sep 04, 1965           MRN: [id number]             PCP: Denita Lung, MD Referring: Denita Lung, MD Visit Date: 02/25/2017 Occupation: '@GUAROCC'$ @    Subjective:  Pain hands   History of Present Illness: Marilyn Salazar is a 51 y.o. female with history of positive ANA and arthralgias. She states she continues to have pain and discomfort in her bilateral hands. None of the other joints are painful. She has off-and-on difficulty wearing her rings. He has not seen any visible joint swelling.  Activities of Daily Living:  Patient reports morning stiffness for 30 minutes.   Patient Reports nocturnal pain.  Difficulty dressing/grooming: Denies Difficulty climbing stairs: Denies Difficulty getting out of chair: Denies Difficulty using hands for taps, buttons, cutlery, and/or writing: Denies   Review of Systems  Constitutional: Negative for fatigue, night sweats, weight gain, weight loss and weakness.  HENT: Negative for mouth sores, trouble swallowing, trouble swallowing, mouth dryness and nose dryness.   Eyes: Negative for pain, redness, visual disturbance and dryness.  Respiratory: Negative for cough, shortness of breath and difficulty breathing.   Cardiovascular: Negative for chest pain, palpitations, hypertension, irregular heartbeat and swelling in legs/feet.  Gastrointestinal: Negative for blood in stool, constipation and diarrhea.  Endocrine: Negative for increased urination.  Genitourinary: Negative for vaginal dryness.  Musculoskeletal: Positive for arthralgias, joint pain and morning stiffness. Negative for joint swelling, myalgias, muscle weakness, muscle tenderness and myalgias.  Skin: Negative for color change, rash, hair loss, skin tightness, ulcers and sensitivity to sunlight.  Allergic/Immunologic: Negative for susceptible to infections.  Neurological: Negative for dizziness, memory loss and  night sweats.  Hematological: Negative for swollen glands.  Psychiatric/Behavioral: Negative for depressed mood and sleep disturbance. The patient is not nervous/anxious.     PMFS History:  Patient Active Problem List   Diagnosis Date Noted  . ANA positive 02/22/2017  . History of hypertension 12/31/2016  . History of anxiety 12/31/2016  . History of gastroesophageal reflux (GERD) 12/31/2016  . History of seasonal allergies 12/31/2016    Past Medical History:  Diagnosis Date  . Allergy    RHINITIS  . Hypertension     No family history on file. History reviewed. No pertinent surgical history. Social History   Social History Narrative  . No narrative on file     Objective: Vital Signs: BP 118/70   Pulse 68   Resp 14   Ht '5\' 6"'$  (1.676 m)   Wt 141 lb (64 kg)   LMP 02/01/2017 (Approximate)   BMI 22.76 kg/m    Physical Exam  Constitutional: She is oriented to person, place, and time. She appears well-developed and well-nourished.  HENT:  Head: Normocephalic and atraumatic.  Eyes: Conjunctivae and EOM are normal.  Neck: Normal range of motion.  Cardiovascular: Normal rate, regular rhythm, normal heart sounds and intact distal pulses.   Pulmonary/Chest: Effort normal and breath sounds normal.  Abdominal: Soft. Bowel sounds are normal.  Lymphadenopathy:    She has no cervical adenopathy.  Neurological: She is alert and oriented to person, place, and time.  Skin: Skin is warm and dry. Capillary refill takes less than 2 seconds.  Psychiatric: She has a normal mood and affect. Her behavior is normal.  Nursing note and vitals reviewed.  Musculoskeletal Exam: C-spine and thoracic lumbar spine good range of motion. Shoulder joints elbow joints wrist joint MCPs PIPs were good range of motion. She had mild tenderness on palpation over bilateral fourth and fifth PIP joints. No synovitis was noted. Hip joints knee joints ankles MTPs PIPs DIPs with good range of motion with no  synovitis.  CDAI Exam: No CDAI exam completed.    Investigation: Findings:     CBC Latest Ref Rng & Units 12/25/2016 12/31/2007  WBC 4.0 - 10.5 K/uL 8.5 5.7  Hemoglobin 11.7 - 15.5 g/dL 14.4 14.3  Hematocrit 35.0 - 45.0 % 42.9 41.3  Platelets 140 - 400 K/uL 270 228   CMP Latest Ref Rng & Units 12/25/2016 12/31/2007  Glucose 65 - 99 mg/dL 88 98  BUN 7 - 25 mg/dL 16 10  Creatinine 0.50 - 1.10 mg/dL 1.14(H) 0.98  Sodium 135 - 146 mmol/L 140 136  Potassium 3.5 - 5.3 mmol/L 4.2 3.1(L)  Chloride 98 - 110 mmol/L 101 100  CO2 20 - 31 mmol/L 30 29  Calcium 8.6 - 10.2 mg/dL 9.9 9.2  Total Protein 6.1 - 8.1 g/dL 7.7 -  Total Bilirubin 0.2 - 1.2 mg/dL 0.5 -  Alkaline Phos 33 - 115 U/L 64 -  AST 10 - 35 U/L 14 -  ALT 6 - 29 U/L 11 -   May 2018 ANA 1:160 speckled, ENA negative, anticardiolipin, beta-2, lupus anticoagulant negative, C3-C4 normal, ESR 4, RF negative, anti-CCP negative, Ace normal Imaging: No results found.  Speciality Comments: No specialty comments available.    Procedures:  No procedures performed Allergies: Bactrim; Hydrocodone; Latex; and Penicillins   Assessment / Plan:     Visit Diagnoses: ANA positive - 1:160 speckled, ENA negative, C3-C4 normal. She has no clinical features of autoimmune disease.  Bilateral hand pain -she continues to have tenderness over her PIP joints. She gives  history of intermittent swelling. RF negative, anti-CCP negative, plan ultrasound bilateral hands-scheduled in August. A list of natural intermittent anti-inflammatories was given. Hand muscle strengthening exercise were demonstrated and handout was given today.  History of anxiety  History of gastroesophageal reflux (GERD)  History of hypertension  History of seasonal allergies    Orders: No orders of the defined types were placed in this encounter.  No orders of the defined types were placed in this encounter.   Face-to-face time spent with patient was 30 minutes. 50%  of time was spent in counseling and coordination of care.  Follow-Up Instructions: Return for Osteoarthritis.   Bo Merino, MD  Note - This record has been created using Editor, commissioning.  Chart creation errors have been sought, but may not always  have been located. Such creation errors do not reflect on  the standard of medical care.

## 2017-02-25 ENCOUNTER — Encounter: Payer: Self-pay | Admitting: Rheumatology

## 2017-02-25 ENCOUNTER — Ambulatory Visit (INDEPENDENT_AMBULATORY_CARE_PROVIDER_SITE_OTHER): Payer: BLUE CROSS/BLUE SHIELD | Admitting: Rheumatology

## 2017-02-25 VITALS — BP 118/70 | HR 68 | Resp 14 | Ht 66.0 in | Wt 141.0 lb

## 2017-02-25 DIAGNOSIS — R7689 Other specified abnormal immunological findings in serum: Secondary | ICD-10-CM

## 2017-02-25 DIAGNOSIS — Z8659 Personal history of other mental and behavioral disorders: Secondary | ICD-10-CM

## 2017-02-25 DIAGNOSIS — Z889 Allergy status to unspecified drugs, medicaments and biological substances status: Secondary | ICD-10-CM

## 2017-02-25 DIAGNOSIS — M79641 Pain in right hand: Secondary | ICD-10-CM | POA: Diagnosis not present

## 2017-02-25 DIAGNOSIS — Z8679 Personal history of other diseases of the circulatory system: Secondary | ICD-10-CM | POA: Diagnosis not present

## 2017-02-25 DIAGNOSIS — Z8719 Personal history of other diseases of the digestive system: Secondary | ICD-10-CM | POA: Diagnosis not present

## 2017-02-25 DIAGNOSIS — R768 Other specified abnormal immunological findings in serum: Secondary | ICD-10-CM

## 2017-02-25 DIAGNOSIS — M79642 Pain in left hand: Secondary | ICD-10-CM | POA: Diagnosis not present

## 2017-02-25 NOTE — Patient Instructions (Signed)
 Natural anti-inflammatories  You can purchase these at [employer], [employer] or online.  . Turmeric (capsules)  . Ginger (ginger root or capsules)  . Omega 3 (Fish, flax seeds, chia seeds, walnuts, almonds)  . Tart cherry (dried or extract)   Patient should be under the care of a physician while taking these supplements. This may not be reproduced without the permission of Dr. Jacoria Keiffer.   Hand Exercises Hand exercises can be helpful to almost anyone. These exercises can strengthen the hands, improve flexibility and movement, and increase blood flow to the hands. These results can make work and daily tasks easier. Hand exercises can be especially helpful for people who have joint pain from arthritis or have nerve damage from overuse (carpal tunnel syndrome). These exercises can also help people who have injured a hand. Most of these hand exercises are fairly gentle stretching routines. You can do them often throughout the day. Still, it is a good idea to ask your health care provider which exercises would be best for you. Warming your hands before exercise may help to reduce stiffness. You can do this with gentle massage or by placing your hands in warm water for 15 minutes. Also, make sure you pay attention to your level of hand pain as you begin an exercise routine. Exercises Knuckle Bend Repeat this exercise 5-10 times with each hand. 1. Stand or sit with your arm, hand, and all five fingers pointed straight up. Make sure your wrist is straight. 2. Gently and slowly bend your fingers down and inward until the tips of your fingers are touching the tops of your palm. 3. Hold this position for a few seconds. 4. Extend your fingers out to their original position, all pointing straight up again.  Finger Fan Repeat this exercise 5-10 times with each hand. 1. Hold your arm and hand out in front of you. Keep your wrist straight. 2. Squeeze your hand into a fist. 3. Hold this  position for a few seconds. 4. Fan out, or spread apart, your hand and fingers as much as possible, stretching every joint fully.  Tabletop Repeat this exercise 5-10 times with each hand. 1. Stand or sit with your arm, hand, and all five fingers pointed straight up. Make sure your wrist is straight. 2. Gently and slowly bend your fingers at the knuckles where they meet the hand until your hand is making an upside-down L shape. Your fingers should form a tabletop. 3. Hold this position for a few seconds. 4. Extend your fingers out to their original position, all pointing straight up again.  Making Os Repeat this exercise 5-10 times with each hand. 1. Stand or sit with your arm, hand, and all five fingers pointed straight up. Make sure your wrist is straight. 2. Make an O shape by touching your pointer finger to your thumb. Hold for a few seconds. Then open your hand wide. 3. Repeat this motion with each finger on your hand.  Table Spread Repeat this exercise 5-10 times with each hand. 1. Place your hand on a table with your palm facing down. Make sure your wrist is straight. 2. Spread your fingers out as much as possible. Hold this position for a few seconds. 3. Slide your fingers back together again. Hold for a few seconds.  Ball Grip  Repeat this exercise 10-15 times with each hand. 1. Hold a tennis ball or another soft ball in your hand. 2. While slowly increasing pressure, squeeze the ball as   hard as possible. 3. Squeeze as hard as you can for 3-5 seconds. 4. Relax and repeat.  Wrist Curls Repeat this exercise 10-15 times with each hand. 1. Sit in a chair that has armrests. 2. Hold a light weight in your hand, such as a dumbbell that weighs 1-3 pounds (0.5-1.4 kg). Ask your health care provider what weight would be best for you. 3. Rest your hand just over the end of the chair arm with your palm facing up. 4. Gently pivot your wrist up and down while holding the weight. Do not  twist your wrist from side to side.  Contact a health care provider if:  Your hand pain or discomfort gets much worse when you do an exercise.  Your hand pain or discomfort does not improve within 2 hours after you exercise. If you have any of these problems, stop doing these exercises right away. Do not do them again unless your health care provider says that you can. Get help right away if:  You develop sudden, severe hand pain. If this happens, stop doing these exercises right away. Do not do them again unless your health care provider says that you can. This information is not intended to replace advice given to you by your health care provider. Make sure you discuss any questions you have with your health care provider. Document Released: 07/11/2015 Document Revised: 01/05/2016 Document Reviewed: 02/07/2015 Elsevier Interactive Patient Education  2018 [employer]  

## 2017-03-18 NOTE — Progress Notes (Deleted)
Assessment / Plan:     Visit Diagnoses: ANA positive - 1:160 speckled, ENA negative, C3-C4 normal. She has no clinical features of autoimmune disease.  Bilateral hand pain -she continues to have tenderness over her PIP joints. She gives  history of intermittent swelling. RF negative, anti-CCP negative, plan ultrasound bilateral hands-scheduled in August. A list of natural intermittent anti-inflammatories was given. Hand muscle strengthening exercise were demonstrated and handout was given today.  History of anxiety  History of gastroesophageal reflux (GERD)  History of hypertension  History of seasonal allergies

## 2017-03-20 ENCOUNTER — Ambulatory Visit: Payer: BLUE CROSS/BLUE SHIELD | Admitting: Rheumatology

## 2017-06-12 ENCOUNTER — Other Ambulatory Visit: Payer: BLUE CROSS/BLUE SHIELD | Admitting: Rheumatology

## 2017-10-10 DIAGNOSIS — Z1151 Encounter for screening for human papillomavirus (HPV): Secondary | ICD-10-CM | POA: Diagnosis not present

## 2017-10-10 DIAGNOSIS — Z01419 Encounter for gynecological examination (general) (routine) without abnormal findings: Secondary | ICD-10-CM | POA: Diagnosis not present

## 2017-10-10 DIAGNOSIS — Z1231 Encounter for screening mammogram for malignant neoplasm of breast: Secondary | ICD-10-CM | POA: Diagnosis not present

## 2018-07-27 DIAGNOSIS — N390 Urinary tract infection, site not specified: Secondary | ICD-10-CM | POA: Diagnosis not present

## 2018-11-18 DIAGNOSIS — N951 Menopausal and female climacteric states: Secondary | ICD-10-CM | POA: Diagnosis not present

## 2018-11-18 DIAGNOSIS — I10 Essential (primary) hypertension: Secondary | ICD-10-CM | POA: Diagnosis not present

## 2018-11-18 DIAGNOSIS — F338 Other recurrent depressive disorders: Secondary | ICD-10-CM | POA: Diagnosis not present

## 2019-02-27 DIAGNOSIS — Z01419 Encounter for gynecological examination (general) (routine) without abnormal findings: Secondary | ICD-10-CM | POA: Diagnosis not present

## 2019-02-27 DIAGNOSIS — Z130 Encounter for screening for diseases of the blood and blood-forming organs and certain disorders involving the immune mechanism: Secondary | ICD-10-CM | POA: Diagnosis not present

## 2019-02-27 DIAGNOSIS — Z13220 Encounter for screening for lipoid disorders: Secondary | ICD-10-CM | POA: Diagnosis not present

## 2019-02-27 DIAGNOSIS — Z8349 Family history of other endocrine, nutritional and metabolic diseases: Secondary | ICD-10-CM | POA: Diagnosis not present

## 2019-02-27 DIAGNOSIS — Z13228 Encounter for screening for other metabolic disorders: Secondary | ICD-10-CM | POA: Diagnosis not present

## 2019-02-27 DIAGNOSIS — Z1231 Encounter for screening mammogram for malignant neoplasm of breast: Secondary | ICD-10-CM | POA: Diagnosis not present

## 2019-05-19 DIAGNOSIS — D492 Neoplasm of unspecified behavior of bone, soft tissue, and skin: Secondary | ICD-10-CM | POA: Diagnosis not present

## 2019-05-19 DIAGNOSIS — Z872 Personal history of diseases of the skin and subcutaneous tissue: Secondary | ICD-10-CM | POA: Diagnosis not present

## 2019-05-19 DIAGNOSIS — L570 Actinic keratosis: Secondary | ICD-10-CM | POA: Diagnosis not present

## 2019-05-19 DIAGNOSIS — C44519 Basal cell carcinoma of skin of other part of trunk: Secondary | ICD-10-CM | POA: Diagnosis not present

## 2019-05-19 DIAGNOSIS — Z09 Encounter for follow-up examination after completed treatment for conditions other than malignant neoplasm: Secondary | ICD-10-CM | POA: Diagnosis not present

## 2019-06-23 DIAGNOSIS — L988 Other specified disorders of the skin and subcutaneous tissue: Secondary | ICD-10-CM | POA: Diagnosis not present

## 2019-06-23 DIAGNOSIS — C44519 Basal cell carcinoma of skin of other part of trunk: Secondary | ICD-10-CM | POA: Diagnosis not present
